# Patient Record
Sex: Female | Born: 1985 | Race: White | Hispanic: No | Marital: Married | Smoking: Never smoker
Health system: Southern US, Community
[De-identification: ages and names within clinical notes are randomized; demographics above are authoritative.]

---

## 2016-11-04 ENCOUNTER — Ambulatory Visit (INDEPENDENT_AMBULATORY_CARE_PROVIDER_SITE_OTHER): Payer: 59

## 2016-11-04 ENCOUNTER — Ambulatory Visit
Admission: EM | Admit: 2016-11-04 | Discharge: 2016-11-04 | Disposition: A | Discharge status: Home / Self Care | Payer: 59 | Attending: Family Medicine | Admitting: Family Medicine

## 2016-11-04 DIAGNOSIS — S61411A Laceration without foreign body of right hand, initial encounter: Secondary | ICD-10-CM | POA: Diagnosis not present

## 2016-11-04 DIAGNOSIS — Z23 Encounter for immunization: Secondary | ICD-10-CM

## 2016-11-04 MED ORDER — TETANUS-DIPHTH-ACELL PERTUSSIS 5-2.5-18.5 LF-MCG/0.5 IM SUSP
0.5000 mL | Freq: Once | INTRAMUSCULAR | Status: AC
  Administered 2016-11-04: 0.5 mL via INTRAMUSCULAR

## 2016-11-04 MED ORDER — CEPHALEXIN 500 MG PO CAPS: 500.0000 mg | ORAL_CAPSULE | Freq: Four times a day (QID) | ORAL | 0 refills | Status: AC

## 2016-11-04 MED ORDER — MUPIROCIN 2 % EX OINT: TOPICAL_OINTMENT | CUTANEOUS | 0 refills | Status: AC

## 2016-11-04 MED ORDER — LIDOCAINE HCL (PF) 1 % IJ SOLN: 5.0000 mL | Freq: Once | INTRAMUSCULAR | Status: DC

## 2016-11-04 MED ORDER — LIDOCAINE-EPINEPHRINE-TETRACAINE (LET) SOLUTION
3.0000 mL | Freq: Once | NASAL | Status: AC
  Administered 2016-11-04: 09:00:00 3 mL via TOPICAL

## 2016-11-04 NOTE — ED Triage Notes (Signed)
Pt was getting up off of the couch and tripped over a dog toy and fell and landed on a glass on the ottoman and it shattered and cut her hand between her middle finger and ring finger around 1:30 this morning

## 2016-11-04 NOTE — Discharge Instructions (Signed)
Take medication as prescribed. Keep clean and protect as discussed.   Return to Urgent care in 7-10 days for suture removal.   Follow up with your primary care physician this week as needed. Return to Urgent care for new or worsening concerns.

## 2016-11-04 NOTE — ED Provider Notes (Signed)
[facility] ____________________________________________  Time seen: Approximately 9:05 AM  I have reviewed the triage vital signs and the nursing notes.   HISTORY  Chief Complaint Laceration (Between middle and ring finger. )   HPI Tracey Buck is a 31 y.o. female presenting for evaluation of right hand laceration. Patient reports that she was laying on a couch last night watching TV, when she went to get up she tripped over a dog toy, causing her to fall forward. Patient reports that she caught herself on the ottoman, but hit a drinking glass in the process of catching herself with her right hand, causing laceration. Patient reports drinking glass was very thick glass, and a piece of glass did go into the space between third and fourth fingers and then pulled out when she moved her hand. Reports this injury occurred at 1:30 this morning. Denies fall to the ground, head injury or loss of consciousness. Denies any other pain or injury. Reports is right-handed. Thinks last tetanus immunization was 2011. States mild pain to laceration site at this time. Reports did irrigate the area copiously last night, and clean the area.  Denies chest pain, shortness of breath, abdominal pain, other extremity pain, extremity swelling or rash. Denies recent sickness. Denies recent antibiotic use.   Patient's last menstrual period was 10/15/2016 (exact date).Denies pregnancy. States not nursing.   History reviewed. No pertinent past medical history. Denies  There are no active problems to display for this patient.   History reviewed. No pertinent surgical history.    Current Facility-Administered Medications:  .  lidocaine (PF) (XYLOCAINE) 1 % injection 5 mL, 5 mL, Other, Once, Renford Dills, NP  Current Outpatient Prescriptions:  .  amphetamine-dextroamphetamine (ADDERALL XR) 20 MG 24 hr capsule, Take 20 mg by mouth daily., Disp: , Rfl:  .  cephALEXin (KEFLEX) 500 MG capsule,  Take 1 capsule (500 mg total) by mouth 4 (four) times daily., Disp: 20 capsule, Rfl: 0 .  mupirocin ointment (BACTROBAN) 2 %, Apply three times a day for 5 days., Disp: 22 g, Rfl: 0  Allergies Patient has no known allergies.  History reviewed. No pertinent family history.  Social History Social History  Substance Use Topics  . Smoking status: Never Smoker  . Smokeless tobacco: Never Used  . Alcohol use Yes    Review of Systems Constitutional: No fever/chills Cardiovascular: Denies chest pain. Respiratory: Denies shortness of breath. Gastrointestinal: No abdominal pain.   Genitourinary: Negative for dysuria. Musculoskeletal: Negative for back pain. Skin: as above.   ____________________________________________   PHYSICAL EXAM:  VITAL SIGNS: ED Triage Vitals  Enc Vitals Group     BP 11/04/16 0834 123/73     Pulse Rate 11/04/16 0834 82     Resp 11/04/16 0834 18     Temp 11/04/16 0834 97.9 F (36.6 C)     Temp Source 11/04/16 0834 Oral     SpO2 11/04/16 0834 100 %     Weight 11/04/16 0835 136 lb (61.7 kg)     Height 11/04/16 0835  (1.6 m)     Head Circumference --      Peak Flow --      Pain Score 11/04/16 0835 6     Pain Loc --      Pain Edu? --      Excl. in GC? --     Constitutional: Alert and oriented. Well appearing and in no acute distress. Eyes: Conjunctivae are normal. PERRL.  ENT  Head: Normocephalic and atraumatic. Hematological/Lymphatic/Immunilogical: No cervical lymphadenopathy. Cardiovascular: Normal rate, regular rhythm. Grossly normal heart sounds.  Good peripheral circulation. Respiratory: Normal respiratory effort without tachypnea nor retractions. Breath sounds are clear and equal bilaterally. No wheezes, rales, rhonchi. Musculoskeletal: No midline cervical, thoracic or lumbar tenderness to palpation.  Neurologic:  Normal speech and language. Speech is normal. No gait instability.  Skin:  Skin is warm, dry Except: right hand web  space between 3/4 fingers approximately 3 cm Laceration present, no foreign bodies visualized or seen, no tendon visualized, no erythema, no drainage, right hand with full range of motion and no motor or tendon deficit, normal distal sensation and capillary refill, mild tenderness directly at the wound margin and mild tenderness directly at third and fourth MCP joint. Right hand otherwise nontender. Right lateral hand <1cm superficial laceration noted, no repair indicated, no foreign body noted, no erythema, nontender.  Psychiatric: Mood and affect are normal. Speech and behavior are normal. Patient exhibits appropriate insight and judgment   ___________________________________________   LABS (all labs ordered are listed, but only abnormal results are displayed)  Labs Reviewed - No data to display  RADIOLOGY  Dg Hand Complete Right  Result Date: 11/04/2016 CLINICAL DATA:  Glass laceration between the middle and ring fingers following a fall last night. EXAM: RIGHT HAND - COMPLETE 3+ VIEW COMPARISON:  None. FINDINGS: Soft tissue defect in the ulnar aspect of the base of the right middle finger. No fracture or radiopaque foreign body. IMPRESSION: Laceration of the base of the middle finger without fracture or radiopaque foreign body. Electronically Signed   By: Beckie Salts M.D.   On: 11/04/2016 09:23   ____________________________________________   PROCEDURES Procedures   Procedure(s) performed:  Procedure explained and verbal consent obtained. Consent: Verbal consent obtained. Written consent not obtained. Risks and benefits: risks, benefits and alternatives were discussed Patient identity confirmed: verbally with patient and hospital-assigned identification number  Consent given by: patient   Laceration Repair Location: right hand between 3/4 fingers web Length: 3 cm Foreign bodies: no foreign bodies noted Tendon involvement: none Nerve involvement: none Wound margins  revised. Preparation: Patient was prepped and draped in the usual sterile fashion. Anesthesia with 1% Lidocaine 5 mls Irrigation solution: saline and betadine Irrigation method: jet lavage Amount of cleaning: copious Repaired with 4-0 nylon Number of sutures: 6 Technique: simple interrupted  Approximation: loose Patient tolerate well. Wound well approximated post repair.  Antibiotic ointment and dressing applied.  Wound care instructions provided.  Observe for any signs of infection or other problems.      INITIAL IMPRESSION / ASSESSMENT AND PLAN / ED COURSE  Pertinent labs & imaging results that were available during my care of the patient were reviewed by me and considered in my medical decision making (see chart for details).  Well-appearing patient. No acute distress. Presents for laceration post mechanical injury. Right hand x-ray per radiologist laceration the base of the middle finger without fracture or radiopaque foreign body. Laceration copiously irrigated, and discussed with patient and counseled regarding potential for remaining foreign bodies present, however did not see on x-ray or visualize or palpate during exam. Laceration repaired. Laceration with open for approximately 8 hours prior to repair. Will place patient on oral cephalexin and topical Bactroban. Discussed keeping clean and close monitoring. Return to urgent care in 7-10 days for suture removal. Discussed indication, risks and benefits of medications with patient.  Discussed follow up with Primary care physician this week. Discussed follow up and  return parameters including no resolution or any worsening concerns. Patient verbalized understanding and agreed to plan.   ____________________________________________   FINAL CLINICAL IMPRESSION(S) / ED DIAGNOSES  Final diagnoses:  Laceration without foreign body of right hand, initial encounter     Discharge Medication List as of 11/04/2016 10:07 AM    START  taking these medications   Details  cephALEXin (KEFLEX) 500 MG capsule Take 1 capsule (500 mg total) by mouth 4 (four) times daily., Starting Tue 11/04/2016, Until Sun 11/09/2016, Normal    mupirocin ointment (BACTROBAN) 2 % Apply three times a day for 5 days., Normal        Note: This dictation was prepared with Dragon dictation along with smaller phrase technology. Any transcriptional errors that result from this process are unintentional.        Renford Dills, NP 11/04/16 1108

## 2018-11-18 IMAGING — CR DG HAND COMPLETE 3+V*R*
3 series · 3 of 3 positions shown · non-contrast
Comparison: None.

CLINICAL DATA: Glass laceration between the middle and ring fingers
following a fall last night.

EXAM:
RIGHT HAND - COMPLETE 3+ VIEW

[hand ap]
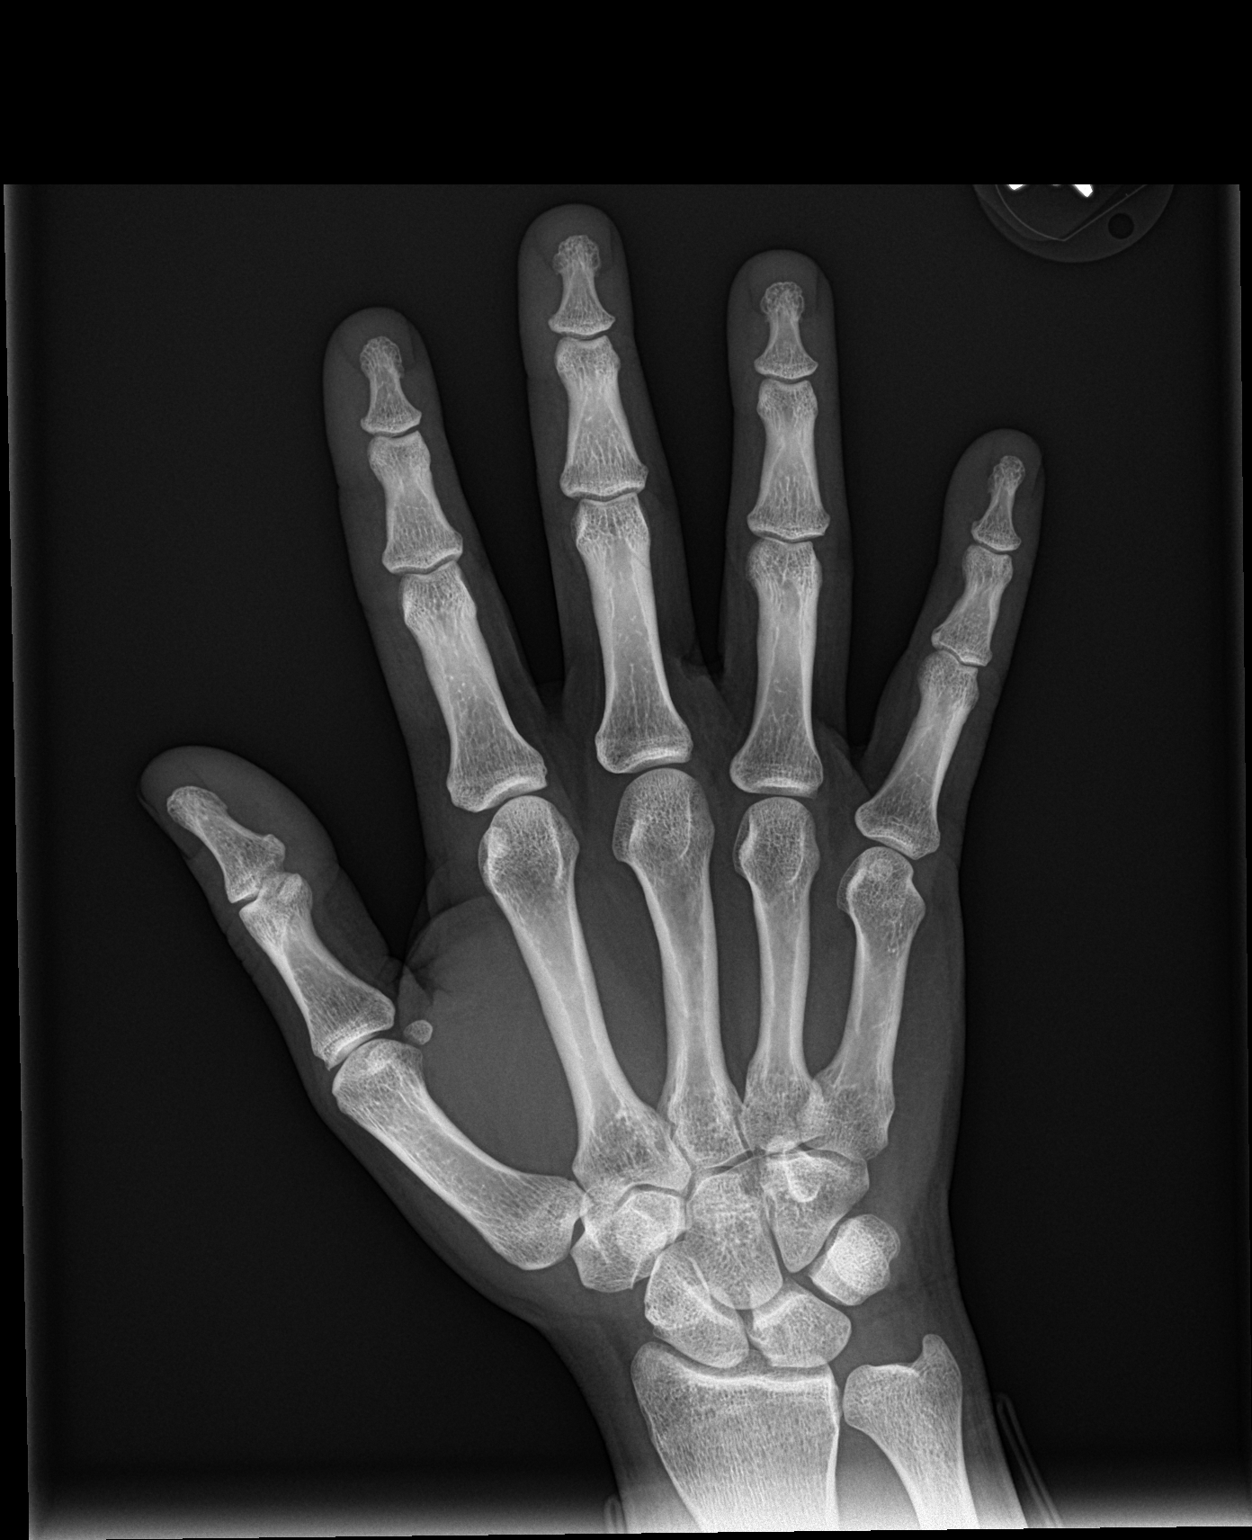

[hand obl]
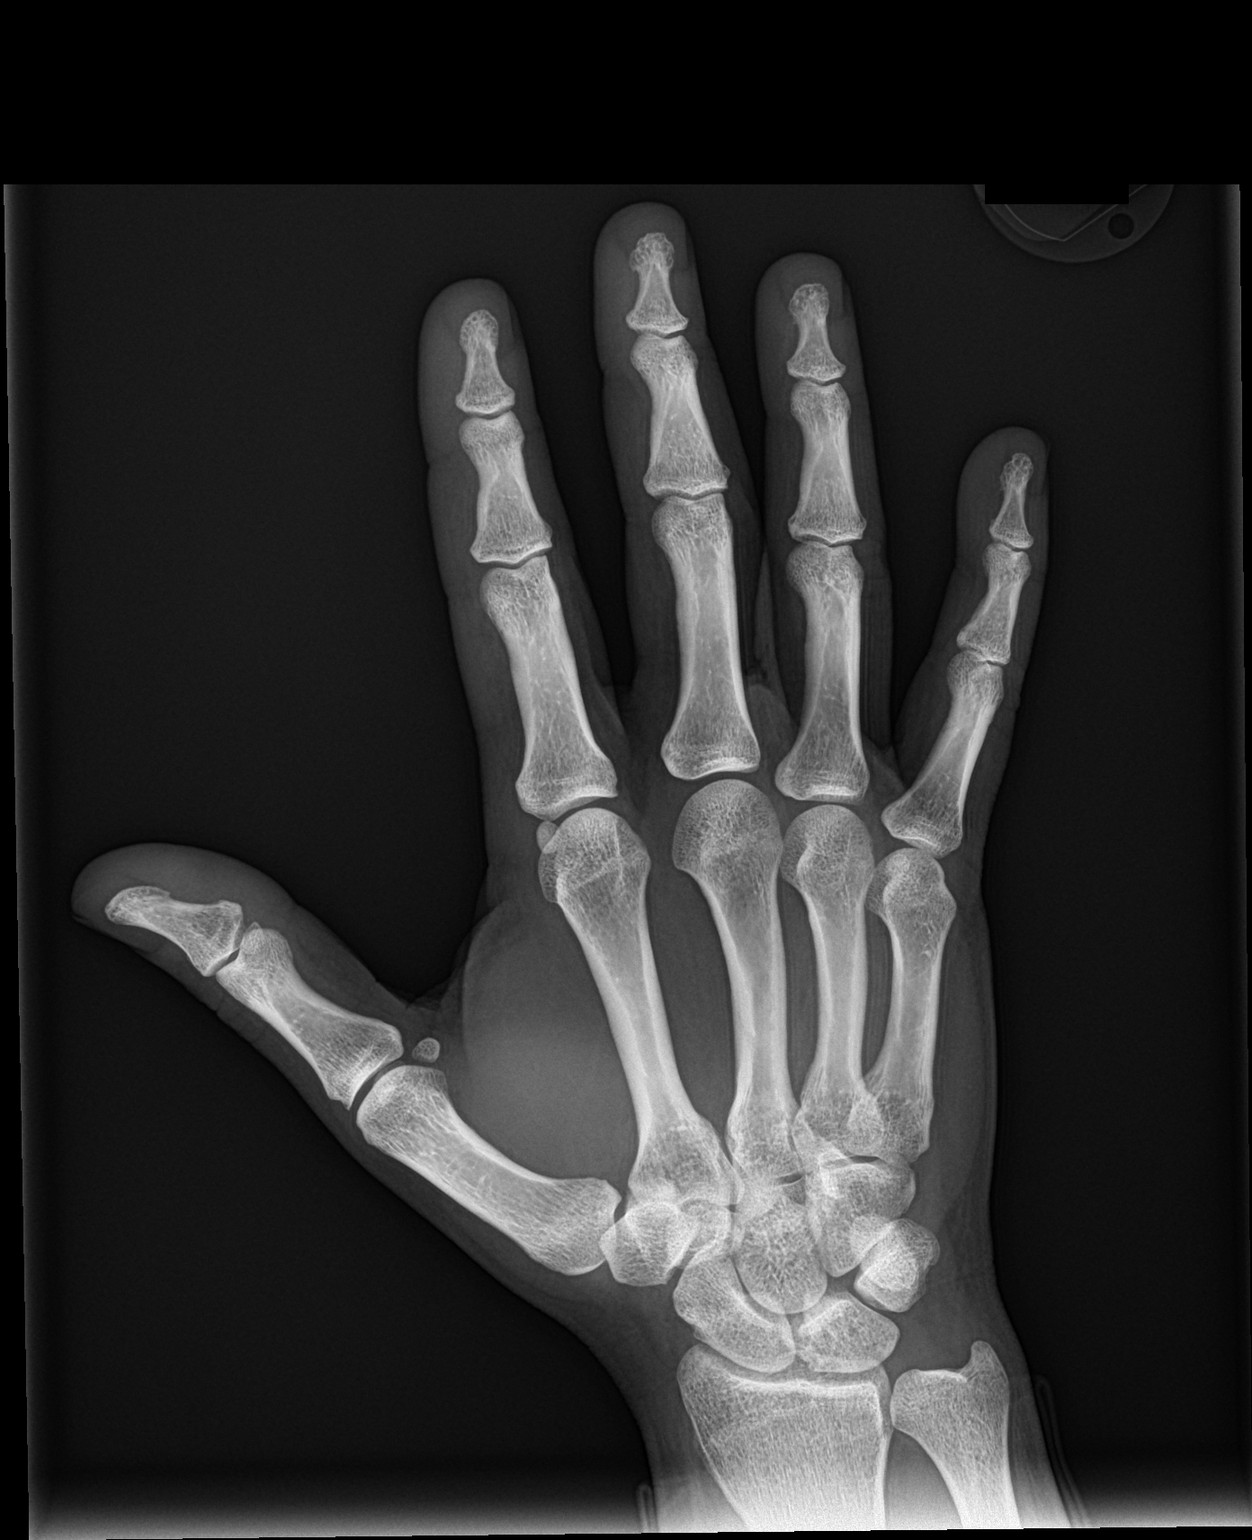

[hand lat]
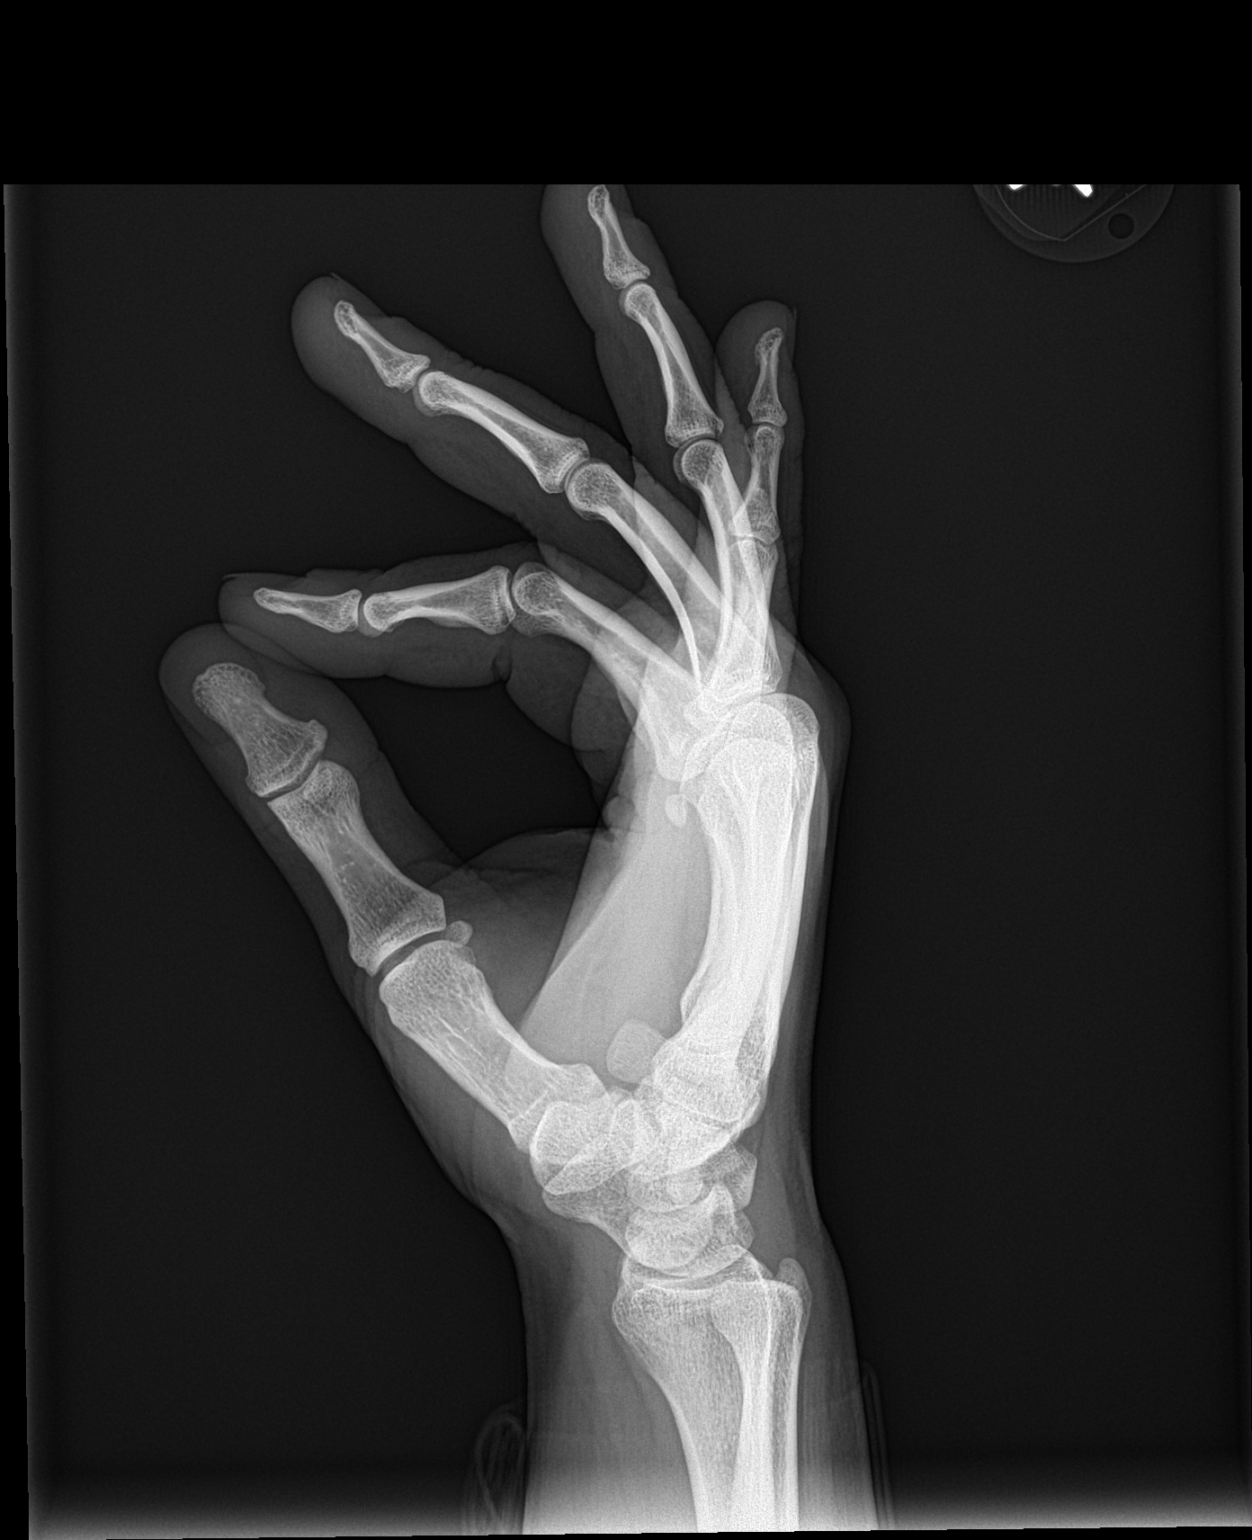

[3 of 3 positions shown; findings below may reference images not displayed]

FINDINGS: Soft tissue defect in the ulnar aspect of the base of the right
middle finger. No fracture or radiopaque foreign body.
IMPRESSION: Laceration of the base of the middle finger without fracture or
radiopaque foreign body.
